# Patient Record
Sex: Male | Born: 1970
Health system: Southern US, Community
[De-identification: ages and names within clinical notes are randomized; demographics above are authoritative.]

## PROBLEM LIST (undated history)

## (undated) DIAGNOSIS — G709 Myoneural disorder, unspecified: Secondary | ICD-10-CM

## (undated) HISTORY — DX: Myoneural disorder, unspecified: G70.9

---

## 2016-08-24 ENCOUNTER — Ambulatory Visit (INDEPENDENT_AMBULATORY_CARE_PROVIDER_SITE_OTHER): Payer: 59 | Admitting: Physician Assistant

## 2016-08-24 VITALS — BP 110/70 | HR 53 | Temp 97.6°F | Resp 16 | Ht 68.5 in | Wt 189.2 lb

## 2016-08-24 DIAGNOSIS — Z130 Encounter for screening for diseases of the blood and blood-forming organs and certain disorders involving the immune mechanism: Secondary | ICD-10-CM | POA: Diagnosis not present

## 2016-08-24 DIAGNOSIS — R5383 Other fatigue: Secondary | ICD-10-CM | POA: Diagnosis not present

## 2016-08-24 DIAGNOSIS — E7800 Pure hypercholesterolemia, unspecified: Secondary | ICD-10-CM | POA: Diagnosis not present

## 2016-08-24 DIAGNOSIS — Z114 Encounter for screening for human immunodeficiency virus [HIV]: Secondary | ICD-10-CM | POA: Diagnosis not present

## 2016-08-24 DIAGNOSIS — Z0000 Encounter for general adult medical examination without abnormal findings: Secondary | ICD-10-CM | POA: Diagnosis not present

## 2016-08-24 NOTE — Progress Notes (Signed)
Matthew Shaw  MRN: [id number] DOB: 06/08/1971  PCP: Wendie Agreste, MD  Subjective:  Pt presents to clinic for a CPE.  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   Musculoskeletal:       Generalized aches from his job  Allergic/Immunologic: Positive for environmental allergies.  Neurological: Positive for headaches (intermittent - motrin resolves the pain).     No current outpatient prescriptions on file prior to visit.   No current facility-administered medications on file prior to visit.     No Known Allergies  Social History   Social History  . Marital status: Married    Spouse name: N/A  . Number of children: N/A  . Years of education: N/A   Occupational History  . Dealer    Social History Main Topics  . Smoking status: Never Smoker  . Smokeless tobacco: Never Used  . Alcohol use Yes     Comment: rare social  . Drug use: No  . Sexual activity: Yes    Partners: Female   Other Topics Concern  . None   Social History Narrative   Marital status: married   Children: 2 biological and 3 step children   Lives with: wife and 3 stepchildren all the time and his children during the summer   Employment: Dealer      Exercise:      Seatbelt: 100%   Guns in home: no            No past surgical history on file.  Family History  Problem Relation Age of Onset  . Diabetes Mother      Objective:  BP 110/70   Pulse (!) 53   Temp 97.6 F (36.4 C) (Oral)   Resp 16   Ht 5' 8.5" (1.74 m)   Wt 189 lb 3.2 oz (85.8 kg)   SpO2 98%   BMI 28.35 kg/m   Physical Exam  Constitutional: He is oriented to person, place, and time and well-developed, well-nourished, and in no distress.  HENT:  Head: Normocephalic and atraumatic.  Right Ear: Hearing, tympanic membrane, external ear and ear canal normal.  Left Ear: Hearing, tympanic membrane, external ear and ear canal normal.  Nose: Nose normal.  Mouth/Throat: Uvula is midline, oropharynx is clear and moist and mucous membranes are normal.  Eyes: Conjunctivae and EOM are normal. Pupils are equal, round, and reactive to light.  Neck: Trachea normal and normal range of motion. Neck supple. No thyroid mass and no thyromegaly present.  Cardiovascular: Normal rate, regular rhythm and normal heart sounds.   No murmur heard. Pulmonary/Chest: Effort normal and breath sounds normal.  Abdominal: Soft. Bowel sounds are normal. Hernia confirmed negative  in the right inguinal area and confirmed negative in the left inguinal area.  Genitourinary: Testes/scrotum normal and penis normal.  Musculoskeletal: Normal range of motion.  Neurological: He is alert and oriented to person, place, and time. Gait normal.  Skin: Skin is warm and dry.  Psychiatric: Mood, memory, affect and judgment normal.    Visual Acuity Screening   Right eye Left eye Both eyes  Without correction: 20/15-1 20/13 20/13-1  With correction:       Assessment and Plan :  Annual physical exam - anticipatory guidance  Elevated cholesterol - Plan: CMP14+EGFR, Lipid panel  Encounter for screening for HIV - Plan: HIV antibody  Screening for deficiency anemia - Plan: CBC with Differential/Platelet  Low energy -  Plan: TestT+TestF+SHBG   Will fill out paperwork for work once his labs return.  Windell Hummingbird PA-C  [facility] 08/24/2016 11:53 AM

## 2016-08-24 NOTE — Patient Instructions (Addendum)
I will contact you with your lab results as soon as they are available.   If you have not heard from me in 2 weeks, please contact me.  The fastest way to get your results is to register for My Chart (see the instructions on the last page of this printout).   IF you received an x-ray today, you will receive an invoice from [facility]. Please contact [facility] at [phone] with questions or concerns regarding your invoice.   IF you received labwork today, you will receive an invoice from [facility]. Please contact [facility] at [phone] with questions or concerns regarding your invoice.   Our billing staff will not be able to assist you with questions regarding bills from these companies.  You will be contacted with the lab results as soon as they are available. The fastest way to get your results is to activate your My Chart account. Instructions are located on the last page of this paperwork. If you have not heard from us regarding the results in 2 weeks, please contact this office.      

## 2016-08-27 ENCOUNTER — Encounter: Payer: Self-pay | Admitting: Physician Assistant

## 2016-08-27 DIAGNOSIS — E7800 Pure hypercholesterolemia, unspecified: Secondary | ICD-10-CM

## 2016-08-27 LAB — CMP14+EGFR
ALT: 24 IU/L (ref 0–44)
AST: 24 IU/L (ref 0–40)
Albumin/Globulin Ratio: 1.4 (ref 1.2–2.2)
Albumin: 4.4 g/dL (ref 3.5–5.5)
Alkaline Phosphatase: 61 IU/L (ref 39–117)
BILIRUBIN TOTAL: 0.3 mg/dL (ref 0.0–1.2)
BUN / CREAT RATIO: 9 (ref 9–20)
BUN: 10 mg/dL (ref 6–24)
CO2: 26 mmol/L (ref 18–29)
CREATININE: 1.14 mg/dL (ref 0.76–1.27)
Calcium: 9.7 mg/dL (ref 8.7–10.2)
Chloride: 102 mmol/L (ref 96–106)
GFR calc non Af Amer: 76 mL/min/{1.73_m2} (ref >59)
GFR, EST AFRICAN AMERICAN: 88 mL/min/{1.73_m2} (ref >59)
GLOBULIN, TOTAL: 3.2 g/dL (ref 1.5–4.5)
Glucose: 97 mg/dL (ref 65–99)
Potassium: 4.5 mmol/L (ref 3.5–5.2)
SODIUM: 142 mmol/L (ref 134–144)
TOTAL PROTEIN: 7.6 g/dL (ref 6.0–8.5)

## 2016-08-27 LAB — CBC WITH DIFFERENTIAL/PLATELET
BASOS ABS: 0 10*3/uL (ref 0.0–0.2)
Basos: 1 %
EOS (ABSOLUTE): 0.2 10*3/uL (ref 0.0–0.4)
Eos: 4 %
HEMATOCRIT: 49.8 % (ref 37.5–51.0)
HEMOGLOBIN: 16.4 g/dL (ref 13.0–17.7)
IMMATURE GRANS (ABS): 0 10*3/uL (ref 0.0–0.1)
Immature Granulocytes: 0 %
LYMPHS ABS: 2.6 10*3/uL (ref 0.7–3.1)
LYMPHS: 45 %
MCH: 28.6 pg (ref 26.6–33.0)
MCHC: 32.9 g/dL (ref 31.5–35.7)
MCV: 87 fL (ref 79–97)
MONOCYTES: 8 %
Monocytes Absolute: 0.4 10*3/uL (ref 0.1–0.9)
NEUTROS ABS: 2.4 10*3/uL (ref 1.4–7.0)
Neutrophils: 42 %
Platelets: 207 10*3/uL (ref 150–379)
RBC: 5.74 x10E6/uL (ref 4.14–5.80)
RDW: 14 % (ref 12.3–15.4)
WBC: 5.7 10*3/uL (ref 3.4–10.8)

## 2016-08-27 LAB — LIPID PANEL
Chol/HDL Ratio: 4.9 ratio units (ref 0.0–5.0)
Cholesterol, Total: 239 mg/dL — ABNORMAL HIGH (ref 100–199)
HDL: 49 mg/dL (ref >39)
LDL CALC: 151 mg/dL — AB (ref 0–99)
Triglycerides: 196 mg/dL — ABNORMAL HIGH (ref 0–149)
VLDL CHOLESTEROL CAL: 39 mg/dL (ref 5–40)

## 2016-08-27 LAB — HIV ANTIBODY (ROUTINE TESTING W REFLEX): HIV Screen 4th Generation wRfx: NONREACTIVE

## 2016-08-27 LAB — TESTT+TESTF+SHBG
Sex Hormone Binding: 63.7 nmol/L — ABNORMAL HIGH (ref 16.5–55.9)
TESTOSTERONE FREE: 14.8 pg/mL (ref 6.8–21.5)
TESTOSTERONE, TOTAL: 554.7 ng/dL (ref 264.0–916.0)

## 2016-08-27 NOTE — Addendum Note (Signed)
Addended by: Morrell RiddleWEBER, Bodhi Stenglein L on: 08/27/2016 04:54 PM   Modules accepted: Orders

## 2016-08-31 MED ORDER — ATORVASTATIN CALCIUM 40 MG PO TABS: 40.0000 mg | ORAL_TABLET | Freq: Every day | ORAL | 0 refills | Status: DC

## 2016-08-31 NOTE — Addendum Note (Signed)
Addended by: Morrell RiddleWEBER, Marlos Carmen L on: 08/31/2016 03:12 PM   Modules accepted: Orders

## 2016-09-03 ENCOUNTER — Telehealth: Payer: Self-pay | Admitting: Emergency Medicine

## 2016-09-03 MED ORDER — ATORVASTATIN CALCIUM 40 MG PO TABS: 40.0000 mg | ORAL_TABLET | Freq: Every day | ORAL | 0 refills | Status: DC

## 2016-09-03 NOTE — Telephone Encounter (Signed)
Pt needs this writtenrx - he knows through [facility]

## 2016-09-03 NOTE — Telephone Encounter (Signed)
Left message medication is at the front desk for pick up

## 2017-05-27 ENCOUNTER — Ambulatory Visit (INDEPENDENT_AMBULATORY_CARE_PROVIDER_SITE_OTHER): Payer: 59

## 2017-05-27 ENCOUNTER — Encounter: Payer: Self-pay | Admitting: Urgent Care

## 2017-05-27 ENCOUNTER — Ambulatory Visit (INDEPENDENT_AMBULATORY_CARE_PROVIDER_SITE_OTHER): Payer: 59 | Admitting: Urgent Care

## 2017-05-27 VITALS — BP 128/70 | HR 71 | Temp 98.2°F | Resp 16 | Ht 68.5 in

## 2017-05-27 DIAGNOSIS — M79604 Pain in right leg: Secondary | ICD-10-CM | POA: Diagnosis not present

## 2017-05-27 DIAGNOSIS — M7631 Iliotibial band syndrome, right leg: Secondary | ICD-10-CM | POA: Diagnosis not present

## 2017-05-27 DIAGNOSIS — M25561 Pain in right knee: Secondary | ICD-10-CM | POA: Diagnosis not present

## 2017-05-27 MED ORDER — CELECOXIB 100 MG PO CAPS: 100.0000 mg | ORAL_CAPSULE | Freq: Two times a day (BID) | ORAL | 1 refills | Status: DC

## 2017-05-27 MED ORDER — CYCLOBENZAPRINE HCL 5 MG PO TABS
5.0000 mg | ORAL_TABLET | Freq: Three times a day (TID) | ORAL | 1 refills | Status: DC | PRN
Start: 2017-05-27 — End: 2017-08-26

## 2017-05-27 NOTE — Progress Notes (Signed)
  MRN: [id number] DOB: 05/14/1971  Subjective:   Matthew Shaw is a 46 y.o. male presenting for 3 day history right sided right knee pain. Pain started after having leg cramps on Saturday, pain is sharp, has difficulty bearing weight on his leg, non-radiating. Tried icing, ibuprofen 800mg  every 4 hours. Works as a Games developerdiesel mechanic, had strenuous work load. Hydrates well. Denies fever, redness, swelling, trauma, falls, knee buckling, twisting injuries.  Molly MaduroRobert has No Known Allergies.  Molly MaduroRobert  has a past medical history of Neuromuscular disorder (HCC). Denies past surgical history.  Objective:   Vitals: BP 128/70   Pulse 71   Temp 98.2 F (36.8 C) (Oral)   Resp 16   Ht 5' 8.5" (1.74 m)   SpO2 98%   BMI 28.35 kg/m   Physical Exam  Constitutional: He is oriented to person, place, and time. He appears well-developed and well-nourished.  Cardiovascular: Normal rate.  Pulmonary/Chest: Effort normal.  Musculoskeletal:       Right knee: He exhibits normal range of motion, no swelling, no effusion, no ecchymosis, no deformity, no laceration, no erythema, normal alignment, normal patellar mobility and no bony tenderness. No tenderness found.       Right upper leg: He exhibits tenderness (over area depicted). He exhibits no bony tenderness, no swelling, no edema, no deformity and no laceration.       Legs: Neurological: He is alert and oriented to person, place, and time.  Skin: Skin is warm and dry.    Dg Knee Complete 4 Views Right  Result Date: 05/27/2017 CLINICAL DATA:  Right knee pain for 3 days. EXAM: RIGHT KNEE - COMPLETE 4+ VIEW COMPARISON:  None. FINDINGS: There is no evidence of acute fracture, dislocation, or sizable knee joint effusion. Joint space widths are preserved without significant arthropathic changes. Soft tissues are unremarkable. IMPRESSION: Negative. Electronically Signed   By: Sebastian AcheAllen  Grady M.D.   On: 05/27/2017 14:27   Assessment and Plan :    It band syndrome,  right  Pain of right lower extremity - Plan: DG Knee Complete 4 Views Right   Will start conservative management for IT band syndrome. Stop using ibuprofen, switch to celecoxib. Add Flexeril, hydrate well, work restrictions provided. Return-to-clinic precautions discussed, patient verbalized understanding.

## 2017-05-27 NOTE — Patient Instructions (Addendum)
You may take 500mg  Tylenol every 6 hours for pain and inflammation.    Iliotibial Band Syndrome Iliotibial band syndrome (ITBS) is a condition that often causes knee pain. It can also cause pain in the outside of your hip, thigh, and knee. The iliotibial band is a strip of tissue that runs from the outside of your hip and down your thigh to the outside of your knee. Repeatedly bending and straightening your knee can irritate the iliotibial band. What are the causes? This condition is caused by inflammation and irritation from the friction of the iliotibial band moving over the thigh bone (femur) when you repeatedly bend and straighten your knee. What increases the risk? This condition is more likely to develop in people who:  Frequently change elevation during their workouts.  Run very long distances.  Recently increased the length or intensity of their workouts.  Run downhill often, or just started running downhill.  Ride a bike very far or often.  You may also be at greater risk if you start a new workout routine without first warming up or if you have a job that requires you to bend, squat, or climb frequently. What are the signs or symptoms? Symptoms of this condition include:  Pain along the outside of your knee that may be worse with activity, especially running or going up and down stairs.  A "snapping" sensation over your knee.  Swelling on the outside of your knee.  Pain or a feeling of tightness in your hip.  How is this diagnosed? This condition is diagnosed based on your symptoms, medical history, and physical exam. You may also see a health care provider who specializes in reducing pain and increasing mobility (physical therapist). A physical therapist may do an exam to check your balance, movement, and way of walking or running (gait) to see whether the way you move could contribute to your injury. You may also have tests to measure your strength, flexibility, and  range of motion. How is this treated? Treatment for this condition includes:  Resting and limiting exercise.  Returning to activities gradually.  Doing range-of-motion and strengthening exercises (physical therapy) as told by your health care provider.  Including low-impact activities, such as swimming, in your exercise routine.  Follow these instructions at home:  If directed, apply ice to the injured area. ? Put ice in a plastic bag. ? Place a towel between your skin and the bag. ? Leave the ice on for 20 minutes, 2-3 times per day.  Return to your normal activities as told by your health care provider. Ask your health care provider what activities are safe for you.  Keep all follow-up visits with your health care provider. This is important. Contact a health care provider if:  Your pain does not improve or gets worse despite treatment. This information is not intended to replace advice given to you by your health care provider. Make sure you discuss any questions you have with your health care provider. Document Released: 11/16/2001 Document Revised: 06/28/2016 Document Reviewed: 06/28/2016 Elsevier Interactive Patient Education  2018 [employer]     IF you received an x-ray today, you will receive an invoice from [facility]. Please contact [facility] at [phone] with questions or concerns regarding your invoice.   IF you received labwork today, you will receive an invoice from [facility]. Please contact [facility] at [phone] with questions or concerns regarding your invoice.   Our billing staff will not be able to assist you with  questions regarding bills from these companies.  You will be contacted with the lab results as soon as they are available. The fastest way to get your results is to activate your My Chart account. Instructions are located on the last page of this paperwork. If you have not heard from us regarding the results in 2 weeks,  please contact this office.

## 2017-05-28 ENCOUNTER — Encounter: Payer: Self-pay | Admitting: Urgent Care

## 2017-05-28 ENCOUNTER — Telehealth: Payer: Self-pay

## 2017-05-28 ENCOUNTER — Telehealth: Payer: Self-pay | Admitting: Urgent Care

## 2017-05-28 NOTE — Telephone Encounter (Signed)
Letter printed, please notify [email or web address]

## 2017-05-28 NOTE — Telephone Encounter (Signed)
Copied from CRM [phone]. Topic: Inquiry >> May 27, 2017  5:04 PM Everardo PacificMoton, Kelly, [place] wrote: Reason for CRM: Patient was [facility] in the office today [facility] and received a light duty paper for his job but there isn't an end date for the light duty work. If someone from the office could please give him a call back at [phone]. Patient would like for the new light duty paper work faxed over to his job at [phone]

## 2017-05-28 NOTE — Telephone Encounter (Signed)
Letter re-sent to patient's MyChart per his request, see patient email encounter dated 05/28/17.

## 2017-05-28 NOTE — Telephone Encounter (Signed)
Pt notified that work letter is ready to be picked up at 102

## 2017-05-28 NOTE — Telephone Encounter (Signed)
Copied from CRM [phone]. Topic: General - Other >> May 28, 2017 12:06 PM Debroah LoopLander, Lumin L wrote: Reason for CRM: Patient prefers for letter to be faxed to his job at  F: [phone] or uploaded to his Mychart. Please notify him when either of these is done.

## 2017-05-29 NOTE — Telephone Encounter (Signed)
Patient notified through phone and mychart.

## 2017-06-04 ENCOUNTER — Telehealth: Payer: Self-pay | Admitting: Urgent Care

## 2017-06-04 NOTE — Telephone Encounter (Signed)
Patient needs FMLA forms completed for his light duty that [employer] [facility] put him on for his lower leg pain. I have completed what I could from the OV notes and highlighted the areas I was not sure about. I will place the forms in Mani's box on 06/04/17 please return to the FMLA/Disability box within 5-7 business days. Thank you!

## 2017-06-05 NOTE — Telephone Encounter (Signed)
I will complete them as soon as I get them in my box. As of yesterday evening, there was nothing there.

## 2017-06-06 NOTE — Telephone Encounter (Signed)
Paperwork scanned and faxed on 06/06/17

## 2017-06-09 ENCOUNTER — Encounter: Payer: Self-pay | Admitting: Urgent Care

## 2017-06-09 ENCOUNTER — Ambulatory Visit (INDEPENDENT_AMBULATORY_CARE_PROVIDER_SITE_OTHER): Payer: 59 | Admitting: Urgent Care

## 2017-06-09 VITALS — BP 112/70 | HR 53 | Temp 98.4°F | Resp 16 | Ht 68.5 in | Wt 193.0 lb

## 2017-06-09 DIAGNOSIS — M7631 Iliotibial band syndrome, right leg: Secondary | ICD-10-CM | POA: Diagnosis not present

## 2017-06-09 DIAGNOSIS — M79604 Pain in right leg: Secondary | ICD-10-CM | POA: Diagnosis not present

## 2017-06-09 NOTE — Patient Instructions (Addendum)
Iliotibial Band Syndrome Iliotibial band syndrome (ITBS) is a condition that often causes knee pain. It can also cause pain in the outside of your hip, thigh, and knee. The iliotibial band is a strip of tissue that runs from the outside of your hip and down your thigh to the outside of your knee. Repeatedly bending and straightening your knee can irritate the iliotibial band. What are the causes? This condition is caused by inflammation and irritation from the friction of the iliotibial band moving over the thigh bone (femur) when you repeatedly bend and straighten your knee. What increases the risk? This condition is more likely to develop in people who:  Frequently change elevation during their workouts.  Run very long distances.  Recently increased the length or intensity of their workouts.  Run downhill often, or just started running downhill.  Ride a bike very far or often.  You may also be at greater risk if you start a new workout routine without first warming up or if you have a job that requires you to bend, squat, or climb frequently. What are the signs or symptoms? Symptoms of this condition include:  Pain along the outside of your knee that may be worse with activity, especially running or going up and down stairs.  A "snapping" sensation over your knee.  Swelling on the outside of your knee.  Pain or a feeling of tightness in your hip.  How is this diagnosed? This condition is diagnosed based on your symptoms, medical history, and physical exam. You may also see a health care provider who specializes in reducing pain and increasing mobility (physical therapist). A physical therapist may do an exam to check your balance, movement, and way of walking or running (gait) to see whether the way you move could contribute to your injury. You may also have tests to measure your strength, flexibility, and range of motion. How is this treated? Treatment for this condition  includes:  Resting and limiting exercise.  Returning to activities gradually.  Doing range-of-motion and strengthening exercises (physical therapy) as told by your health care provider.  Including low-impact activities, such as swimming, in your exercise routine.  Follow these instructions at home:  If directed, apply ice to the injured area. ? Put ice in a plastic bag. ? Place a towel between your skin and the bag. ? Leave the ice on for 20 minutes, 2-3 times per day.  Return to your normal activities as told by your health care provider. Ask your health care provider what activities are safe for you.  Keep all follow-up visits with your health care provider. This is important. Contact a health care provider if:  Your pain does not improve or gets worse despite treatment. This information is not intended to replace advice given to you by your health care provider. Make sure you discuss any questions you have with your health care provider. Document Released: 11/16/2001 Document Revised: 06/28/2016 Document Reviewed: 06/28/2016 Elsevier Interactive Patient Education  2018 [employer]     IF you received an x-ray today, you will receive an invoice from [facility]. Please contact [facility] at [phone] with questions or concerns regarding your invoice.   IF you received labwork today, you will receive an invoice from [facility]. Please contact [facility] at [phone] with questions or concerns regarding your invoice.   Our billing staff will not be able to assist you with questions regarding bills from these companies.  You will be contacted with the lab results  as soon as they are available. The fastest way to get your results is to activate your My Chart account. Instructions are located on the last page of this paperwork. If you have not heard from [place] regarding the results in 2 weeks, please contact this office.

## 2017-06-09 NOTE — Progress Notes (Signed)
   MRN: [id number] DOB: 06/10/1970  Subjective:   Matthew CrumbleRobert A Shaw is a 46 y.o. male presenting for follow up on right lateral knee pain. Last OV for same was 05/27/2017, started management for IT band syndrome. He was started on Celebrex, Flexeril, work restrictions for light duty were given. However, his employer requested that he be on short term disability. FMLA paper work was completed. Patient went back to work on 06/04/2017, has performed his full responsibilities. He presents today to be cleared to go back to work. Reports that his pain is significantly improved.  Molly MaduroRobert has a current medication list which includes the following prescription(s): atorvastatin, celecoxib, and cyclobenzaprine. Also has No Known Allergies.  Molly MaduroRobert  has a past medical history of Neuromuscular disorder (HCC). Also  has no past surgical history on file.  Objective:   Vitals: BP 112/70   Pulse (!) 53   Temp 98.4 F (36.9 C) (Oral)   Resp 16   Ht 5' 8.5" (1.74 m)   Wt 193 lb (87.5 kg)   SpO2 97%   BMI 28.92 kg/m   Physical Exam  Constitutional: He is oriented to person, place, and time. He appears well-developed and well-nourished.  Cardiovascular: Normal rate.  Pulmonary/Chest: Effort normal.  Musculoskeletal:       Right knee: He exhibits normal range of motion, no swelling, no effusion, no ecchymosis, no deformity, no laceration, no erythema, normal alignment, normal patellar mobility and no bony tenderness. No tenderness found.  Neurological: He is alert and oriented to person, place, and time.   Assessment and Plan :   It band syndrome, right  Pain of right lower extremity   Improved, cleared to return to work. Continue Celebrex and Flexeril as needed. Patient can rtc prn. Consider PT or referral to ortho.   Wallis BambergMario Reino Lybbert, PA-C [facility] [phone] 06/09/2017  10:35 AM

## 2017-08-26 ENCOUNTER — Ambulatory Visit (INDEPENDENT_AMBULATORY_CARE_PROVIDER_SITE_OTHER): Payer: 59 | Admitting: Physician Assistant

## 2017-08-26 ENCOUNTER — Encounter: Payer: Self-pay | Admitting: Physician Assistant

## 2017-08-26 ENCOUNTER — Other Ambulatory Visit: Payer: Self-pay

## 2017-08-26 VITALS — BP 122/74 | HR 68 | Temp 97.8°F | Resp 18 | Ht 68.0 in | Wt 191.6 lb

## 2017-08-26 DIAGNOSIS — Z23 Encounter for immunization: Secondary | ICD-10-CM | POA: Diagnosis not present

## 2017-08-26 DIAGNOSIS — E7800 Pure hypercholesterolemia, unspecified: Secondary | ICD-10-CM | POA: Diagnosis not present

## 2017-08-26 DIAGNOSIS — Z1389 Encounter for screening for other disorder: Secondary | ICD-10-CM | POA: Diagnosis not present

## 2017-08-26 DIAGNOSIS — Z0000 Encounter for general adult medical examination without abnormal findings: Secondary | ICD-10-CM

## 2017-08-26 DIAGNOSIS — Z13228 Encounter for screening for other metabolic disorders: Secondary | ICD-10-CM

## 2017-08-26 DIAGNOSIS — Z130 Encounter for screening for diseases of the blood and blood-forming organs and certain disorders involving the immune mechanism: Secondary | ICD-10-CM

## 2017-08-26 LAB — POCT URINALYSIS DIP (MANUAL ENTRY)
Bilirubin, UA: NEGATIVE
GLUCOSE UA: NEGATIVE mg/dL
Ketones, POC UA: NEGATIVE mg/dL
Leukocytes, UA: NEGATIVE
NITRITE UA: NEGATIVE
PH UA: 5.5 (ref 5.0–8.0)
PROTEIN UA: NEGATIVE mg/dL
RBC UA: NEGATIVE
Spec Grav, UA: >=1.03 — AB (ref 1.010–1.025)
UROBILINOGEN UA: 0.2 U/dL

## 2017-08-26 NOTE — Patient Instructions (Signed)
     IF you received an x-ray today, you will receive an invoice from [facility]. Please contact [facility] at [phone] with questions or concerns regarding your invoice.   IF you received labwork today, you will receive an invoice from [facility]. Please contact [facility] at [phone] with questions or concerns regarding your invoice.   Our billing staff will not be able to assist you with questions regarding bills from these companies.  You will be contacted with the lab results as soon as they are available. The fastest way to get your results is to activate your My Chart account. Instructions are located on the last page of this paperwork. If you have not heard from us regarding the results in 2 weeks, please contact this office.     

## 2017-08-26 NOTE — Progress Notes (Signed)
Matthew Shaw  MRN: [id number] DOB: [birth year]  PCP: Morrell Riddle, PA-C  Subjective:  Pt presents to clinic for a CPE.  Last dental exam: once a year Last vision exam: 3 months ago  Vaccinations      Tetanus - 2008 - the last one he can remember  Typical meals for patient: 3x/meals, lunch out sandwich, dinner homecooked Typical beverage choices: Diet Dr pepper and some water Exercises: 3x/week for about 30 mins - walking Sleeps: 7 hrs per night and sleeping well   Patient Active Problem List   Diagnosis Date Noted  . Elevated cholesterol 08/24/2016    Review of Systems  Constitutional: Negative.   HENT: Negative.   Eyes: Negative.   Respiratory: Negative.   Cardiovascular: Negative.   Gastrointestinal: Negative.   Endocrine: Negative.   Genitourinary: Negative.   Musculoskeletal: Negative.   Skin: Negative.   Allergic/Immunologic: Positive for environmental allergies.  Neurological: Negative.   Hematological: Negative.   Psychiatric/Behavioral: Negative.      Current Outpatient Medications on File Prior to Visit  Medication Sig Dispense Refill  . atorvastatin (LIPITOR) 40 MG tablet Take 1 tablet (40 mg total) by mouth daily. 90 tablet 0   No current facility-administered medications on file prior to visit.     No Known Allergies  Social History   Socioeconomic History  . Marital status: Married    Spouse name: None  . Number of children: None  . Years of education: None  . Highest education level: None  Social Needs  . Financial resource strain: None  . Food insecurity - worry: None  . Food insecurity - inability: None  . Transportation needs - medical: None  . Transportation needs - non-medical: None  Occupational History  . Occupation: Curator  Tobacco Use  . Smoking status: Never Smoker  . Smokeless tobacco: Never Used  Substance and Sexual Activity  . Alcohol use: Yes    Comment: rare social  . Drug use: No  . Sexual activity: Yes   Partners: Female  Other Topics Concern  . None  Social History Narrative   Marital status: married   Children: 2 biological and 3 step children   Lives with: wife and 3 stepchildren all the time and his children during the summer   Employment: Curator      Exercise:     Seatbelt: 100%   Guns in home: no         History reviewed. No pertinent surgical history.  Family History  Problem Relation Age of Onset  . Diabetes Mother   . Hypertension Mother      Objective:  BP 122/74   Pulse 68   Temp 97.8 F (36.6 C) (Oral)   Resp 18   Ht 5\' 8"  (1.727 m)   Wt 191 lb 9.6 oz (86.9 kg)   SpO2 97%   BMI 29.13 kg/m   Physical Exam  Constitutional: He is oriented to person, place, and time and well-developed, well-nourished, and in no distress.  HENT:  Head: Normocephalic and atraumatic.  Right Ear: Hearing, tympanic membrane, external ear and ear canal normal.  Left Ear: Hearing, tympanic membrane, external ear and ear canal normal.  Nose: Nose normal.  Mouth/Throat: Uvula is midline, oropharynx is clear and moist and mucous membranes are normal.  Eyes: Conjunctivae and EOM are normal. Pupils are equal, round, and reactive to light.  Neck: Trachea normal and normal range of motion. Neck supple. No thyroid mass and no thyromegaly present.  Cardiovascular: Normal rate, regular rhythm and normal heart sounds.  No murmur heard. Pulmonary/Chest: Effort normal and breath sounds normal.  Abdominal: Soft. Bowel sounds are normal. Hernia confirmed negative in the right inguinal area and confirmed negative in the left inguinal area.  Musculoskeletal: Normal range of motion.  Neurological: He is alert and oriented to person, place, and time. Gait normal.  Skin: Skin is warm and dry.  Psychiatric: Mood, memory, affect and judgment normal.    Wt Readings from Last 3 Encounters:  08/26/17 191 lb 9.6 oz (86.9 kg)  06/09/17 193 lb (87.5 kg)  08/24/16 189 lb 3.2 oz (85.8 kg)      Visual Acuity Screening   Right eye Left eye Both eyes  Without correction: 20/20 20/15 20/15   With correction:       Assessment and Plan :  Annual physical exam  Need for Tdap vaccination - Plan: Tdap vaccine greater than or equal to 7yo IM  Elevated cholesterol - Plan: Lipid panel  Screening for iron deficiency anemia - Plan: CBC with Differential/Platelet  Screening for blood or protein in urine - Plan: POCT urinalysis dipstick  Screening for metabolic disorder - Plan: Comprehensive metabolic panel   Form filled out for insurance.  Benny LennertSarah Rene Sizelove PA-C  [facility] at [facility] 08/26/2017 12:47 PM

## 2017-08-27 LAB — COMPREHENSIVE METABOLIC PANEL
AG RATIO: 1.2 (calc) (ref 1.0–2.5)
ALBUMIN MSPROF: 4.1 g/dL (ref 3.6–5.1)
ALT: 24 U/L (ref 9–46)
AST: 27 U/L (ref 10–40)
Alkaline phosphatase (APISO): 59 U/L (ref 40–115)
BUN: 12 mg/dL (ref 7–25)
CHLORIDE: 106 mmol/L (ref 98–110)
CO2: 28 mmol/L (ref 20–32)
Calcium: 9.3 mg/dL (ref 8.6–10.3)
Creat: 1.13 mg/dL (ref 0.60–1.35)
GLOBULIN: 3.3 g/dL (ref 1.9–3.7)
GLUCOSE: 87 mg/dL (ref 65–99)
POTASSIUM: 3.8 mmol/L (ref 3.5–5.3)
SODIUM: 140 mmol/L (ref 135–146)
Total Bilirubin: 0.4 mg/dL (ref 0.2–1.2)
Total Protein: 7.4 g/dL (ref 6.1–8.1)

## 2017-08-27 LAB — LIPID PANEL
Cholesterol: 246 mg/dL — ABNORMAL HIGH (ref <200)
HDL: 35 mg/dL — ABNORMAL LOW (ref >40)
Non-HDL Cholesterol (Calc): 211 mg/dL (calc) — ABNORMAL HIGH (ref <130)
Total CHOL/HDL Ratio: 7 (calc) — ABNORMAL HIGH (ref <5.0)
Triglycerides: 670 mg/dL — ABNORMAL HIGH (ref <150)

## 2017-08-27 LAB — CBC WITH DIFFERENTIAL/PLATELET
BASOS ABS: 53 {cells}/uL (ref 0–200)
BASOS PCT: 0.8 %
EOS ABS: 172 {cells}/uL (ref 15–500)
Eosinophils Relative: 2.6 %
HCT: 45.6 % (ref 38.5–50.0)
Hemoglobin: 16 g/dL (ref 13.2–17.1)
Lymphs Abs: 3340 cells/uL (ref 850–3900)
MCH: 29 pg (ref 27.0–33.0)
MCHC: 35.1 g/dL (ref 32.0–36.0)
MCV: 82.6 fL (ref 80.0–100.0)
MPV: 11.2 fL (ref 7.5–12.5)
Monocytes Relative: 8 %
NEUTROS PCT: 38 %
Neutro Abs: 2508 cells/uL (ref 1500–7800)
Platelets: 197 10*3/uL (ref 140–400)
RBC: 5.52 10*6/uL (ref 4.20–5.80)
RDW: 13 % (ref 11.0–15.0)
TOTAL LYMPHOCYTE: 50.6 %
WBC: 6.6 10*3/uL (ref 3.8–10.8)
WBCMIX: 528 {cells}/uL (ref 200–950)

## 2017-09-08 ENCOUNTER — Telehealth: Payer: Self-pay | Admitting: Physician Assistant

## 2017-09-08 NOTE — Telephone Encounter (Signed)
Per DPR, left VM for pt to call the office and make an appt for 6 weeks out with Benny LennertSarah Weber to check cholesterol    When pt calls back, please make him an appt as close to 6 weeks from today as possible 09/08/17.with Benny LennertSarah Weber.  Thanks!

## 2018-01-01 ENCOUNTER — Ambulatory Visit (INDEPENDENT_AMBULATORY_CARE_PROVIDER_SITE_OTHER): Payer: 59 | Admitting: Physician Assistant

## 2018-01-01 ENCOUNTER — Encounter: Payer: Self-pay | Admitting: Physician Assistant

## 2018-01-01 ENCOUNTER — Other Ambulatory Visit: Payer: Self-pay

## 2018-01-01 VITALS — BP 116/70 | HR 69 | Temp 97.8°F | Resp 18 | Ht 68.0 in | Wt 190.8 lb

## 2018-01-01 DIAGNOSIS — E7800 Pure hypercholesterolemia, unspecified: Secondary | ICD-10-CM | POA: Diagnosis not present

## 2018-01-01 DIAGNOSIS — M109 Gout, unspecified: Secondary | ICD-10-CM

## 2018-01-01 MED ORDER — INDOMETHACIN 50 MG PO CAPS: 50.0000 mg | ORAL_CAPSULE | Freq: Two times a day (BID) | ORAL | 0 refills | Status: DC

## 2018-01-01 NOTE — Progress Notes (Signed)
Matthew Shaw  MRN: [id number] DOB: 23-May-1971  PCP: Mancel Bale, PA-C  Chief Complaint  Patient presents with  . paperwork    needs filled out     Subjective:  Pt presents to clinic for paperwork for [employer].  He has also started the lipitor for his cholesterol.  He is tolerating is well.  He is also having problems with his gout.  He has about 2-3 episodes a year - typically in his right foot - currently in his ankle for the last 1.5 weeks but typically his in great toe.  He has never been on allopurinol.  History is obtained by patient.  Review of Systems  Patient Active Problem List   Diagnosis Date Noted  . Gout 01/01/2018  . Elevated cholesterol 08/24/2016    Current Outpatient Medications on File Prior to Visit  Medication Sig Dispense Refill  . atorvastatin (LIPITOR) 40 MG tablet Take 1 tablet (40 mg total) by mouth daily. 90 tablet 0  . celecoxib (CELEBREX) 100 MG capsule Take 100 mg by mouth 2 (two) times daily.     No current facility-administered medications on file prior to visit.     No Known Allergies  Past Medical History:  Diagnosis Date  . Neuromuscular disorder ([place])    ocular myasthenia gravis - trouble with drooping lids   Social History   Social History Narrative   Marital status: married   Children: 2 biological and 3 step children   Lives with: wife and 3 stepchildren all the time and his children during the summer   Employment: Dealer      Exercise:     Seatbelt: 100%   Guns in home: no        Social History   Tobacco Use  . Smoking status: Never Smoker  . Smokeless tobacco: Never Used  Substance Use Topics  . Alcohol use: Yes    Comment: rare social  . Drug use: No   family history includes Diabetes in his mother; Hypertension in his mother.     Objective:  BP 116/70   Pulse 69   Temp 97.8 F (36.6 C) (Oral)   Resp 18   Ht _0  (1.727 m)   Wt 190 lb 12.8 oz (86.5 kg)   SpO2 97%   BMI 29.01 kg/m  Body  mass index is 29.01 kg/m.  Wt Readings from Last 3 Encounters:  01/01/18 190 lb 12.8 oz (86.5 kg)  08/26/17 191 lb 9.6 oz (86.9 kg)  06/09/17 193 lb (87.5 kg)    Physical Exam  Constitutional: He is oriented to person, place, and time. He appears well-developed and well-nourished.  HENT:  Head: Normocephalic and atraumatic.  Right Ear: External ear normal.  Left Ear: External ear normal.  Eyes: Conjunctivae are normal.  Neck: Normal range of motion.  Cardiovascular: Normal rate, regular rhythm and normal heart sounds.  No murmur heard. Pulmonary/Chest: Effort normal and breath sounds normal. He has no wheezes.  Neurological: He is alert and oriented to person, place, and time.  Skin: Skin is warm and dry.  Psychiatric: He has a normal mood and affect. His behavior is normal. Judgment and thought content normal.  Vitals reviewed.   Assessment and Plan :  Elevated cholesterol - Plan: Lipid panel, CMP14+EGFR - pt has restarted his lipitor - we will recheck that today and give him guidance on the next step  Gout, unspecified cause, unspecified chronicity, unspecified site - Plan: indomethacin (INDOCIN) 50 MG  capsule, Uric Acid - if uric acid if >6.5 we will start allopurinol  FORMS filled out  Patient verbalized to me that they understand the following: diagnosis, what is being done for them, what to expect and what should be done at home.  Their questions have been answered.  See after visit summary for patient specific instructions.  Windell Hummingbird PA-C  [facility] at [facility] 01/01/2018 12:00 PM  Please note: Portions of this report may have been transcribed using dragon voice recognition software. Every effort was made to ensure accuracy; however, inadvertent computerized transcription errors may be present.

## 2018-01-01 NOTE — Patient Instructions (Signed)
     IF you received an x-ray today, you will receive an invoice from [facility]. Please contact [facility] at [phone] with questions or concerns regarding your invoice.   IF you received labwork today, you will receive an invoice from [facility]. Please contact [facility] at [phone] with questions or concerns regarding your invoice.   Our billing staff will not be able to assist you with questions regarding bills from these companies.  You will be contacted with the lab results as soon as they are available. The fastest way to get your results is to activate your My Chart account. Instructions are located on the last page of this paperwork. If you have not heard from us regarding the results in 2 weeks, please contact this office.     

## 2018-01-02 LAB — CMP14+EGFR
A/G RATIO: 1.4 (ref 1.2–2.2)
ALK PHOS: 69 IU/L (ref 39–117)
ALT: 21 IU/L (ref 0–44)
AST: 29 IU/L (ref 0–40)
Albumin: 4 g/dL (ref 3.5–5.5)
BUN/Creatinine Ratio: 14 (ref 9–20)
BUN: 16 mg/dL (ref 6–24)
Bilirubin Total: 0.6 mg/dL (ref 0.0–1.2)
CALCIUM: 9 mg/dL (ref 8.7–10.2)
CO2: 24 mmol/L (ref 20–29)
Chloride: 105 mmol/L (ref 96–106)
Creatinine, Ser: 1.11 mg/dL (ref 0.76–1.27)
GFR calc Af Amer: 90 mL/min/{1.73_m2} (ref >59)
GFR, EST NON AFRICAN AMERICAN: 78 mL/min/{1.73_m2} (ref >59)
GLOBULIN, TOTAL: 2.9 g/dL (ref 1.5–4.5)
Glucose: 94 mg/dL (ref 65–99)
POTASSIUM: 5 mmol/L (ref 3.5–5.2)
SODIUM: 141 mmol/L (ref 134–144)
Total Protein: 6.9 g/dL (ref 6.0–8.5)

## 2018-01-02 LAB — LIPID PANEL
CHOL/HDL RATIO: 4.8 ratio (ref 0.0–5.0)
CHOLESTEROL TOTAL: 198 mg/dL (ref 100–199)
HDL: 41 mg/dL (ref >39)
LDL Calculated: 131 mg/dL — ABNORMAL HIGH (ref 0–99)
Triglycerides: 131 mg/dL (ref 0–149)
VLDL CHOLESTEROL CAL: 26 mg/dL (ref 5–40)

## 2018-01-02 LAB — URIC ACID: URIC ACID: 7.2 mg/dL (ref 3.7–8.6)

## 2018-01-05 MED ORDER — ALLOPURINOL 100 MG PO TABS: 100.0000 mg | ORAL_TABLET | Freq: Every day | ORAL | 1 refills | Status: DC

## 2018-01-05 NOTE — Addendum Note (Signed)
Addended by: Morrell RiddleWEBER, [place] L on: 01/05/2018 08:10 AM   Modules accepted: Orders

## 2018-01-28 ENCOUNTER — Telehealth: Payer: Self-pay | Admitting: Physician Assistant

## 2018-01-28 ENCOUNTER — Other Ambulatory Visit: Payer: Self-pay | Admitting: Physician Assistant

## 2018-01-28 DIAGNOSIS — M109 Gout, unspecified: Secondary | ICD-10-CM

## 2018-01-28 NOTE — Telephone Encounter (Signed)
Left a voicemail in regards to Mr. Matthew Shaw's appt with Benny LennertSarah Weber on 02/27/2018. The provider is leaving our office and needs to reschedule/cancel.

## 2018-02-27 ENCOUNTER — Ambulatory Visit: Payer: 59 | Admitting: Physician Assistant

## 2018-03-08 ENCOUNTER — Other Ambulatory Visit: Payer: Self-pay | Admitting: Physician Assistant

## 2018-03-08 DIAGNOSIS — M109 Gout, unspecified: Secondary | ICD-10-CM

## 2018-03-10 ENCOUNTER — Other Ambulatory Visit: Payer: Self-pay | Admitting: Physician Assistant

## 2018-03-10 DIAGNOSIS — M109 Gout, unspecified: Secondary | ICD-10-CM

## 2019-05-05 ENCOUNTER — Encounter: Payer: Self-pay | Admitting: Registered Nurse

## 2019-05-05 ENCOUNTER — Other Ambulatory Visit: Payer: Self-pay

## 2019-05-05 ENCOUNTER — Ambulatory Visit (INDEPENDENT_AMBULATORY_CARE_PROVIDER_SITE_OTHER): Payer: BC Managed Care – PPO | Admitting: Registered Nurse

## 2019-05-05 VITALS — BP 124/73 | HR 55 | Temp 98.4°F | Ht 68.0 in | Wt 200.0 lb

## 2019-05-05 DIAGNOSIS — Z13228 Encounter for screening for other metabolic disorders: Secondary | ICD-10-CM | POA: Diagnosis not present

## 2019-05-05 DIAGNOSIS — Z1211 Encounter for screening for malignant neoplasm of colon: Secondary | ICD-10-CM

## 2019-05-05 DIAGNOSIS — E7800 Pure hypercholesterolemia, unspecified: Secondary | ICD-10-CM

## 2019-05-05 DIAGNOSIS — G7000 Myasthenia gravis without (acute) exacerbation: Secondary | ICD-10-CM | POA: Diagnosis not present

## 2019-05-05 DIAGNOSIS — Z130 Encounter for screening for diseases of the blood and blood-forming organs and certain disorders involving the immune mechanism: Secondary | ICD-10-CM

## 2019-05-05 DIAGNOSIS — R0681 Apnea, not elsewhere classified: Secondary | ICD-10-CM

## 2019-05-05 DIAGNOSIS — Z131 Encounter for screening for diabetes mellitus: Secondary | ICD-10-CM | POA: Diagnosis not present

## 2019-05-05 DIAGNOSIS — Z0001 Encounter for general adult medical examination with abnormal findings: Secondary | ICD-10-CM | POA: Diagnosis not present

## 2019-05-05 DIAGNOSIS — Z1329 Encounter for screening for other suspected endocrine disorder: Secondary | ICD-10-CM | POA: Diagnosis not present

## 2019-05-05 DIAGNOSIS — M109 Gout, unspecified: Secondary | ICD-10-CM

## 2019-05-05 MED ORDER — ATORVASTATIN CALCIUM 40 MG PO TABS: 40.0000 mg | ORAL_TABLET | Freq: Every day | ORAL | 3 refills | Status: AC

## 2019-05-05 MED ORDER — ALLOPURINOL 100 MG PO TABS: 100.0000 mg | ORAL_TABLET | Freq: Every day | ORAL | 3 refills | Status: DC

## 2019-05-05 MED ORDER — ATORVASTATIN CALCIUM 40 MG PO TABS: 40.0000 mg | ORAL_TABLET | Freq: Every day | ORAL | 3 refills | Status: DC

## 2019-05-05 NOTE — Patient Instructions (Signed)
° ° ° °  If you have lab work done today you will be contacted with your lab results within the next 2 weeks.  If you have not heard from us then please contact us. The fastest way to get your results is to register for My Chart. ° ° °IF you received an x-ray today, you will receive an invoice from [facility]. Please contact [facility] at [phone] with questions or concerns regarding your invoice.  ° °IF you received labwork today, you will receive an invoice from [facility]. Please contact [facility] at [phone] with questions or concerns regarding your invoice.  ° °Our billing staff will not be able to assist you with questions regarding bills from these companies. ° °You will be contacted with the lab results as soon as they are available. The fastest way to get your results is to activate your My Chart account. Instructions are located on the last page of this paperwork. If you have not heard from us regarding the results in 2 weeks, please contact this office. °  ° ° ° °

## 2019-05-05 NOTE — Progress Notes (Signed)
Established Patient Office Visit  Subjective:  Patient ID: Matthew Shaw, male    DOB: 06-10-1971  Age: 48 y.o. MRN: [id number]  CC:  Chief Complaint  Patient presents with  . Annual Exam    declines flu and cologuard    HPI [name] presents for CPE  No complaints. Requesting sleep study as he snores frequently and his wife states witnessed apnea. Often tired during the day. Due for colonoscopy, will send referral.  History of ocular myasthenia gravis - seems like it may be worsening, will refer to neuro. It has been some time since he has seen neurology.  History reviewed and updated as appropriate  Past Medical History:  Diagnosis Date  . Neuromuscular disorder ([place])    ocular myasthenia gravis - trouble with drooping lids    No past surgical history on file.  Family History  Problem Relation Age of Onset  . Diabetes Mother   . Hypertension Mother     Social History   Socioeconomic History  . Marital status: Married    Spouse name: Not on file  . Number of children: Not on file  . Years of education: Not on file  . Highest education level: Not on file  Occupational History  . Occupation: Dealer  Social Needs  . Financial resource strain: Not on file  . Food insecurity    Worry: Not on file    Inability: Not on file  . Transportation needs    Medical: Not on file    Non-medical: Not on file  Tobacco Use  . Smoking status: Never Smoker  . Smokeless tobacco: Never Used  Substance and Sexual Activity  . Alcohol use: Yes    Comment: rare social  . Drug use: No  . Sexual activity: Yes    Partners: Female  Lifestyle  . Physical activity    Days per week: Not on file    Minutes per session: Not on file  . Stress: Not on file  Relationships  . Social Herbalist on phone: Not on file    Gets together: Not on file    Attends religious service: Not on file    Active member of club or organization: Not on file    Attends meetings of  clubs or organizations: Not on file    Relationship status: Not on file  . Intimate partner violence    Fear of current or ex partner: Not on file    Emotionally abused: Not on file    Physically abused: Not on file    Forced sexual activity: Not on file  Other Topics Concern  . Not on file  Social History Narrative   Marital status: married   Children: 2 biological and 3 step children   Lives with: wife and 3 stepchildren all the time and his children during the summer   Employment: Dealer      Exercise:     Seatbelt: 100%   Guns in home: no         Outpatient Medications Prior to Visit  Medication Sig Dispense Refill  . allopurinol (ZYLOPRIM) 100 MG tablet TAKE 1 TABLET BY MOUTH EVERY DAY 30 tablet 1  . atorvastatin (LIPITOR) 40 MG tablet Take 1 tablet (40 mg total) by mouth daily. 90 tablet 0  . celecoxib (CELEBREX) 100 MG capsule Take 100 mg by mouth 2 (two) times daily.    . indomethacin (INDOCIN) 50 MG capsule TAKE 1 CAPSULE (50 MG  TOTAL) BY MOUTH 2 (TWO) TIMES DAILY WITH A MEAL. 60 capsule 0   No facility-administered medications prior to visit.     No Known Allergies  ROS Review of Systems  Constitutional: Negative.   HENT: Negative.   Eyes: Negative.   Respiratory: Negative.   Cardiovascular: Negative.   Gastrointestinal: Negative.   Endocrine: Negative.   Genitourinary: Negative.   Musculoskeletal: Negative.   Skin: Negative.   Allergic/Immunologic: Negative.   Neurological: Negative.   Hematological: Negative.   Psychiatric/Behavioral: Negative.   All other systems reviewed and are negative.     Objective:    Physical Exam  Constitutional: He is oriented to person, place, and time. He appears well-developed and well-nourished.  HENT:  Head: Normocephalic and atraumatic.  Right Ear: External ear normal.  Left Ear: External ear normal.  Nose: Nose normal.  Mouth/Throat: Oropharynx is clear and moist. No oropharyngeal exudate.  Eyes: Pupils are  equal, round, and reactive to light. Conjunctivae and EOM are normal. Right eye exhibits no discharge. Left eye exhibits no discharge. No scleral icterus.  Neck: Normal range of motion. Neck supple. No JVD present. No tracheal deviation present. No thyromegaly present.  Cardiovascular: Normal rate, regular rhythm, normal heart sounds and intact distal pulses. Exam reveals no gallop and no friction rub.  No murmur heard. Pulmonary/Chest: Effort normal and breath sounds normal. No respiratory distress. He has no wheezes. He has no rales. He exhibits no tenderness.  Abdominal: Soft. Bowel sounds are normal. He exhibits no distension and no mass. There is no abdominal tenderness. There is no rebound and no guarding.  Musculoskeletal: Normal range of motion.        General: No tenderness, deformity or edema.  Lymphadenopathy:    He has no cervical adenopathy.  Neurological: He is alert and oriented to person, place, and time. No cranial nerve deficit. He exhibits normal muscle tone. Coordination normal.  Skin: Skin is warm and dry. No rash noted. No erythema. No pallor.  Psychiatric: He has a normal mood and affect. His behavior is normal. Judgment and thought content normal.  Nursing note and vitals reviewed.   BP 124/73   Pulse (!) 55   Temp 98.4 F (36.9 C)   Ht _0  (1.727 m)   Wt 200 lb (90.7 kg)   SpO2 96%   BMI 30.41 kg/m  Wt Readings from Last 3 Encounters:  05/05/19 200 lb (90.7 kg)  01/01/18 190 lb 12.8 oz (86.5 kg)  08/26/17 191 lb 9.6 oz (86.9 kg)     There are no preventive care reminders to display for this patient.  There are no preventive care reminders to display for this patient.  No results found for: TSH Lab Results  Component Value Date   WBC 6.6 08/26/2017   HGB 16.0 08/26/2017   HCT 45.6 08/26/2017   MCV 82.6 08/26/2017   PLT 197 08/26/2017   Lab Results  Component Value Date   NA 141 01/01/2018   K 5.0 01/01/2018   CO2 24 01/01/2018   GLUCOSE 94  01/01/2018   BUN 16 01/01/2018   CREATININE 1.11 01/01/2018   BILITOT 0.6 01/01/2018   ALKPHOS 69 01/01/2018   AST 29 01/01/2018   ALT 21 01/01/2018   PROT 6.9 01/01/2018   ALBUMIN 4.0 01/01/2018   CALCIUM 9.0 01/01/2018   Lab Results  Component Value Date   CHOL 198 01/01/2018   Lab Results  Component Value Date   HDL 41 01/01/2018   Lab  Results  Component Value Date   LDLCALC 131 (H) 01/01/2018   Lab Results  Component Value Date   TRIG 131 01/01/2018   Lab Results  Component Value Date   CHOLHDL 4.8 01/01/2018   No results found for: HGBA1C    Assessment & Plan:   Problem List Items Addressed This Visit      Other   Elevated cholesterol   Relevant Medications   atorvastatin (LIPITOR) 40 MG tablet   Other Relevant Orders   Lipid panel   Gout   Relevant Medications   allopurinol (ZYLOPRIM) 100 MG tablet    Other Visit Diagnoses    Screening for diabetes mellitus    -  Primary   Relevant Orders   Hemoglobin A1c   Myasthenia gravis ([place])       Relevant Orders   Ambulatory referral to Neurology   Witnessed episode of apnea       Relevant Orders   Ambulatory referral to Pulmonology   Special screening for malignant neoplasms, colon       Relevant Orders   Ambulatory referral to Gastroenterology   Screening for endocrine, metabolic and immunity disorder       Relevant Orders   CBC   CMP14+EGFR      Meds ordered this encounter  Medications  . allopurinol (ZYLOPRIM) 100 MG tablet    Sig: Take 1 tablet (100 mg total) by mouth daily.    Dispense:  90 tablet    Refill:  3    Order Specific Question:   Supervising Provider    Answer:   Delia Chimes A [id number]  . atorvastatin (LIPITOR) 40 MG tablet    Sig: Take 1 tablet (40 mg total) by mouth daily.    Dispense:  90 tablet    Refill:  3    Order Specific Question:   Supervising Provider    Answer:   Forrest Moron [id number]    Follow-up: No follow-ups on file.   PLAN  Normal findings  on exam  Paperwork for his job filled out - will finish when his labs are returned.  Meds refilled   Referrals sent for neuro, colonoscopy, and pulmonology  Labs drawn, will follow up if warranted  Otherwise, follow up in 1 year for CPE and labs Maximiano Coss, NP

## 2019-05-06 LAB — CMP14+EGFR
ALT: 27 IU/L (ref 0–44)
AST: 28 IU/L (ref 0–40)
Albumin/Globulin Ratio: 1.4 (ref 1.2–2.2)
Albumin: 4.3 g/dL (ref 4.0–5.0)
Alkaline Phosphatase: 62 IU/L (ref 39–117)
BUN/Creatinine Ratio: 11 (ref 9–20)
BUN: 12 mg/dL (ref 6–24)
Bilirubin Total: 0.5 mg/dL (ref 0.0–1.2)
CO2: 18 mmol/L — ABNORMAL LOW (ref 20–29)
Calcium: 9.1 mg/dL (ref 8.7–10.2)
Chloride: 105 mmol/L (ref 96–106)
Creatinine, Ser: 1.07 mg/dL (ref 0.76–1.27)
GFR calc Af Amer: 93 mL/min/{1.73_m2} (ref >59)
GFR calc non Af Amer: 81 mL/min/{1.73_m2} (ref >59)
Globulin, Total: 3 g/dL (ref 1.5–4.5)
Glucose: 102 mg/dL — ABNORMAL HIGH (ref 65–99)
Potassium: 4 mmol/L (ref 3.5–5.2)
Sodium: 140 mmol/L (ref 134–144)
Total Protein: 7.3 g/dL (ref 6.0–8.5)

## 2019-05-06 LAB — HEMOGLOBIN A1C
Est. average glucose Bld gHb Est-mCnc: 123 mg/dL
Hgb A1c MFr Bld: 5.9 % — ABNORMAL HIGH (ref 4.8–5.6)

## 2019-05-06 LAB — CBC
Hematocrit: 47.9 % (ref 37.5–51.0)
Hemoglobin: 16.1 g/dL (ref 13.0–17.7)
MCH: 28 pg (ref 26.6–33.0)
MCHC: 33.6 g/dL (ref 31.5–35.7)
MCV: 83 fL (ref 79–97)
Platelets: 203 10*3/uL (ref 150–450)
RBC: 5.75 x10E6/uL (ref 4.14–5.80)
RDW: 13.1 % (ref 11.6–15.4)
WBC: 5.2 10*3/uL (ref 3.4–10.8)

## 2019-05-06 LAB — LIPID PANEL
Chol/HDL Ratio: 5.5 ratio — ABNORMAL HIGH (ref 0.0–5.0)
Cholesterol, Total: 237 mg/dL — ABNORMAL HIGH (ref 100–199)
HDL: 43 mg/dL (ref >39)
LDL Chol Calc (NIH): 159 mg/dL — ABNORMAL HIGH (ref 0–99)
Triglycerides: 189 mg/dL — ABNORMAL HIGH (ref 0–149)
VLDL Cholesterol Cal: 35 mg/dL (ref 5–40)

## 2019-05-10 ENCOUNTER — Telehealth: Payer: Self-pay

## 2019-05-10 NOTE — Telephone Encounter (Signed)
Pt came in to pick up physical forms

## 2019-05-11 NOTE — Progress Notes (Signed)
I have called the patient and discussed his results. A1c elevated to 5.9 - monitor processed foods and sugar intake. Limit where possible Lipids elevated - he admits noncompliance with atorvastatin 40mg  PO qd - encouraged compliance. Will recheck in 1 year  Kathrin Ruddy, NP

## 2019-06-07 ENCOUNTER — Encounter: Payer: Self-pay | Admitting: Registered Nurse

## 2019-07-06 ENCOUNTER — Other Ambulatory Visit: Payer: Self-pay

## 2019-07-06 ENCOUNTER — Ambulatory Visit: Payer: BC Managed Care – PPO | Admitting: Neurology

## 2019-07-06 ENCOUNTER — Encounter: Payer: Self-pay | Admitting: Neurology

## 2019-07-06 VITALS — BP 126/79 | HR 57 | Temp 96.8°F | Ht 68.0 in | Wt 207.0 lb

## 2019-07-06 DIAGNOSIS — G7000 Myasthenia gravis without (acute) exacerbation: Secondary | ICD-10-CM | POA: Diagnosis not present

## 2019-07-06 DIAGNOSIS — G4733 Obstructive sleep apnea (adult) (pediatric): Secondary | ICD-10-CM | POA: Diagnosis not present

## 2019-07-06 MED ORDER — PYRIDOSTIGMINE BROMIDE 60 MG PO TABS: 60.0000 mg | ORAL_TABLET | Freq: Three times a day (TID) | ORAL | 6 refills | Status: AC

## 2019-07-06 NOTE — Progress Notes (Signed)
PATIENT: Matthew Shaw DOB: Apr 28, 1971  Chief Complaint  Patient presents with  . Myasthenia Gravis    Reports being diagnosed with ocular myasthenia gravis in 2003.  Approximately ten years ago, he was taking Mestinon.  His left eye droop is steadily becoming worse.   Marland Kitchen PCP    Janeece Agee, NP     HISTORICAL  Matthew Shaw is a 49 year old male, seen in request by his primary care nurse practitioner Janeece Agee for evaluation of myasthenia gravis, frequent snoring, initial evaluation was on July 06, 2019.  I have reviewed and summarized the referring note from the referring physician.  He had a past medical history of hyperlipidemia, gout, work as a Brewing technologist, which requires heavy lifting, he denies any difficulty lifting up to 100  He was diagnosed with ocular myasthenia gravis in 2003 at [facility] base, he presented with intermittent fatigable double vision mostly involving left eyelid, sometimes involving right eye, occasionally double vision, but he denies swallowing difficulty, no limb muscle weakness, he was only treated with Mestinon 60 mg every night from 2003-2008.  His symptoms has much improved, he has lost to follow-up, but he continues to deal with intermittent ptosis, especially left eye, frequent oblique double vision, his symptoms become much obvious since 2020, sometimes he has to wear sunglasses to cover his eye to avoid social embarrassment, he denies difficulty perform his job, his ptosis, and oblique double vision became more obvious if his fatigue, lack of sleep.  He denies limb muscle weakness, or bulbar muscle weakness.  He also had long history of snoring, frequent awakening at nighttime, daytime fatigue, sleepiness, on examination, he has small jaw, with narrow oropharyngeal space,   REVIEW OF SYSTEMS: Full 14 system review of systems performed and notable only for as above All other review of systems were negative.  ALLERGIES: No  Known Allergies  HOME MEDICATIONS: Current Outpatient Medications  Medication Sig Dispense Refill  . allopurinol (ZYLOPRIM) 100 MG tablet Take 1 tablet (100 mg total) by mouth daily. 90 tablet 3  . atorvastatin (LIPITOR) 40 MG tablet Take 1 tablet (40 mg total) by mouth daily. 90 tablet 3  . celecoxib (CELEBREX) 100 MG capsule Take 100 mg by mouth 2 (two) times daily.    . indomethacin (INDOCIN) 50 MG capsule TAKE 1 CAPSULE (50 MG TOTAL) BY MOUTH 2 (TWO) TIMES DAILY WITH A MEAL. 60 capsule 0   No current facility-administered medications for this visit.    PAST MEDICAL HISTORY: Past Medical History:  Diagnosis Date  . Neuromuscular disorder (HCC)    ocular myasthenia gravis - trouble with drooping lids    PAST SURGICAL HISTORY: History reviewed. No pertinent surgical history.  FAMILY HISTORY: Family History  Problem Relation Age of Onset  . Diabetes Mother   . Hypertension Mother   . Healthy Father     SOCIAL HISTORY: Social History   Socioeconomic History  . Marital status: Married    Spouse name: Not on file  . Number of children: 2  . Years of education: 24  . Highest education level: High school graduate  Occupational History  . Occupation: Curator  Tobacco Use  . Smoking status: Never Smoker  . Smokeless tobacco: Never Used  Substance and Sexual Activity  . Alcohol use: Yes    Comment: rare social  . Drug use: No  . Sexual activity: Yes    Partners: Female  Other Topics Concern  . Not on file  Social  History Narrative   Marital status: married   Children: 2 biological and 3 step children   Lives with: wife and 3 stepchildren all the time and his children during the summer   Employment: Curator      Exercise:     Seatbelt: 100%   Guns in home: no      Caffeine use: 2-3 cups per day.   Right-handed.              Social Determinants of Health   Financial Resource Strain:   . Difficulty of Paying Living Expenses: Not on file  Food  Insecurity:   . Worried About Programme researcher, broadcasting/film/video in the Last Year: Not on file  . Ran Out of Food in the Last Year: Not on file  Transportation Needs:   . Lack of Transportation (Medical): Not on file  . Lack of Transportation (Non-Medical): Not on file  Physical Activity:   . Days of Exercise per Week: Not on file  . Minutes of Exercise per Session: Not on file  Stress:   . Feeling of Stress : Not on file  Social Connections:   . Frequency of Communication with Friends and Family: Not on file  . Frequency of Social Gatherings with Friends and Family: Not on file  . Attends Religious Services: Not on file  . Active Member of Clubs or Organizations: Not on file  . Attends Banker Meetings: Not on file  . Marital Status: Not on file  Intimate Partner Violence:   . Fear of Current or Ex-Partner: Not on file  . Emotionally Abused: Not on file  . Physically Abused: Not on file  . Sexually Abused: Not on file     PHYSICAL EXAM   Vitals:   07/06/19 0906  BP: 126/79  Pulse: (!) 57  Temp: (!) 96.8 F (36 C)  Weight: 207 lb (93.9 kg)  Height: 5\' 8"  (1.727 m)    Not recorded      Body mass index is 31.47 kg/m.  PHYSICAL EXAMNIATION:  Gen: NAD, conversant, well nourised, well groomed                     Cardiovascular: Regular rate rhythm, no peripheral edema, warm, nontender. Eyes: Conjunctivae clear without exudates or hemorrhage Neck: Supple, no carotid bruits. Pulmonary: Clear to auscultation bilaterally   NEUROLOGICAL EXAM:  MENTAL STATUS: Speech:    Speech is normal; fluent and spontaneous with normal comprehension.  Cognition:     Orientation to time, place and person     Normal recent and remote memory     Normal Attention span and concentration     Normal Language, naming, repeating,spontaneous speech     Fund of knowledge   CRANIAL NERVES: CN II: Visual fields are full to confrontation. Pupils are round equal and briskly reactive to  light. CN III, IV, VI: He has fatigable left ptosis, cover one third of the upper edge of left pupil, mild left orbicularis oculi weakness, mild bilateral cheek puff weakness.  Cover and uncover testing showed right suprophopia,, left infrophoria CN V: Facial sensation is intact to light touch CN VII: Face is symmetric with normal eye closure  CN VIII: Hearing is normal to causal conversation. CN IX, X: Phonation is normal. CN XI: Head turning and shoulder shrug are intact CN XII: He has small withdrawal, narrow oral pharyngeal space  MOTOR: There is no pronator drift of out-stretched arms. Muscle bulk and tone are  normal. Muscle strength is normal.  REFLEXES: Reflexes are 2+ and symmetric at the biceps, triceps, knees, and ankles. Plantar responses are flexor.  SENSORY: Intact to light touch, pinprick and vibratory sensation are intact in fingers and toes.  COORDINATION: There is no trunk or limb dysmetria noted.  GAIT/STANCE: Posture is normal. Gait is steady with normal steps, base, arm swing, and turning. Heel and toe walking are normal. Tandem gait is normal.  Romberg is absent.   DIAGNOSTIC DATA (LABS, IMAGING, TESTING) - I reviewed patient records, labs, notes, testing and imaging myself where available.   ASSESSMENT AND PLAN  Matthew Shaw is a 49 y.o. male   Angelica Ran since 2003  Mainly restricted to eye muscles,  Complete evaluation with MRI of the brain  Laboratory evaluations including myasthenia gravis panel  Restart Mestinon 60 mg 3 times a day  May consider CT chest without contrast to rule out thymus pathology  Obstructive sleep apnea  He has loud snoring, frequent wakening, small jaw, narrowed oropharyngeal space,  Refer to sleep study   Marcial Pacas, M.D. Ph.D.  [facility] [address] [place], [place] [address] Ph: [phone] Fax: [phone]  CC: Maximiano Coss, NP

## 2019-07-12 ENCOUNTER — Telehealth: Payer: Self-pay | Admitting: Neurology

## 2019-07-12 NOTE — Telephone Encounter (Signed)
Please call patient, laboratory evaluation showed positive acetylcholine binding antibody, consistent with his diagnosis of myasthenia gravis,  Fasting lipid profile showed elevated total cholesterol, elevated LDL 159, mild elevated A1c 5.9, he should start moderate exercise, diet control  I have forwarded the laboratory evaluation to his primary care nurse practitioner Janeece Agee, he should continue work with his primary care physician for hyperlipidemia, prediabetes.

## 2019-07-12 NOTE — Telephone Encounter (Signed)
I spoke to the patient and he verbalized understanding of his lab results. He will follow up with his PCP for cholesterol and A1C.

## 2019-07-13 ENCOUNTER — Institutional Professional Consult (permissible substitution): Payer: BC Managed Care – PPO | Admitting: Neurology

## 2019-07-13 ENCOUNTER — Telehealth: Payer: Self-pay | Admitting: Neurology

## 2019-07-13 NOTE — Telephone Encounter (Signed)
Pt was a no show to his sleep consult appointment scheduled for today.

## 2019-07-16 ENCOUNTER — Encounter: Payer: Self-pay | Admitting: Neurology

## 2019-07-19 LAB — COMPREHENSIVE METABOLIC PANEL
ALT: 39 IU/L (ref 0–44)
AST: 31 IU/L (ref 0–40)
Albumin/Globulin Ratio: 1.4 (ref 1.2–2.2)
Albumin: 4.1 g/dL (ref 4.0–5.0)
Alkaline Phosphatase: 61 IU/L (ref 39–117)
BUN/Creatinine Ratio: 11 (ref 9–20)
BUN: 12 mg/dL (ref 6–24)
Bilirubin Total: 0.5 mg/dL (ref 0.0–1.2)
CO2: 25 mmol/L (ref 20–29)
Calcium: 9.1 mg/dL (ref 8.7–10.2)
Chloride: 105 mmol/L (ref 96–106)
Creatinine, Ser: 1.1 mg/dL (ref 0.76–1.27)
GFR calc Af Amer: 90 mL/min/{1.73_m2} (ref >59)
GFR calc non Af Amer: 78 mL/min/{1.73_m2} (ref >59)
Globulin, Total: 2.9 g/dL (ref 1.5–4.5)
Glucose: 91 mg/dL (ref 65–99)
Potassium: 4.1 mmol/L (ref 3.5–5.2)
Sodium: 141 mmol/L (ref 134–144)
Total Protein: 7 g/dL (ref 6.0–8.5)

## 2019-07-19 LAB — THYROID PANEL WITH TSH
Free Thyroxine Index: 2.3 (ref 1.2–4.9)
T3 Uptake Ratio: 30 % (ref 24–39)
T4, Total: 7.5 ug/dL (ref 4.5–12.0)
TSH: 4.49 u[IU]/mL (ref 0.450–4.500)

## 2019-07-19 LAB — CBC WITH DIFFERENTIAL/PLATELET
Basophils Absolute: 0 10*3/uL (ref 0.0–0.2)
Basos: 1 %
EOS (ABSOLUTE): 0.2 10*3/uL (ref 0.0–0.4)
Eos: 3 %
Hematocrit: 46.7 % (ref 37.5–51.0)
Hemoglobin: 15.9 g/dL (ref 13.0–17.7)
Immature Grans (Abs): 0 10*3/uL (ref 0.0–0.1)
Immature Granulocytes: 0 %
Lymphocytes Absolute: 2.8 10*3/uL (ref 0.7–3.1)
Lymphs: 47 %
MCH: 29.1 pg (ref 26.6–33.0)
MCHC: 34 g/dL (ref 31.5–35.7)
MCV: 85 fL (ref 79–97)
Monocytes Absolute: 0.6 10*3/uL (ref 0.1–0.9)
Monocytes: 10 %
Neutrophils Absolute: 2.3 10*3/uL (ref 1.4–7.0)
Neutrophils: 39 %
Platelets: 185 10*3/uL (ref 150–450)
RBC: 5.47 x10E6/uL (ref 4.14–5.80)
RDW: 13 % (ref 11.6–15.4)
WBC: 5.8 10*3/uL (ref 3.4–10.8)

## 2019-07-19 LAB — MYASTHENIA GRAVIS FULL PANEL
AChR Binding Ab, Serum: 1.48 nmol/L — ABNORMAL HIGH (ref 0.00–0.24)
Acetylchol Block Ab: 28 % — ABNORMAL HIGH (ref 0–25)
Acetylcholine Modulat Ab: <12 % (ref 0–20)
Anti-striation Abs: NEGATIVE

## 2019-07-19 LAB — CK: Total CK: 288 U/L (ref 49–439)

## 2019-09-08 ENCOUNTER — Ambulatory Visit: Payer: BC Managed Care – PPO | Admitting: Neurology

## 2019-09-18 IMAGING — DX DG KNEE COMPLETE 4+V*R*
4 series · 4 of 4 positions shown · non-contrast
Comparison: None.

CLINICAL DATA: Right knee pain for 3 days.

EXAM:
RIGHT KNEE - COMPLETE 4+ VIEW

[knee ap]
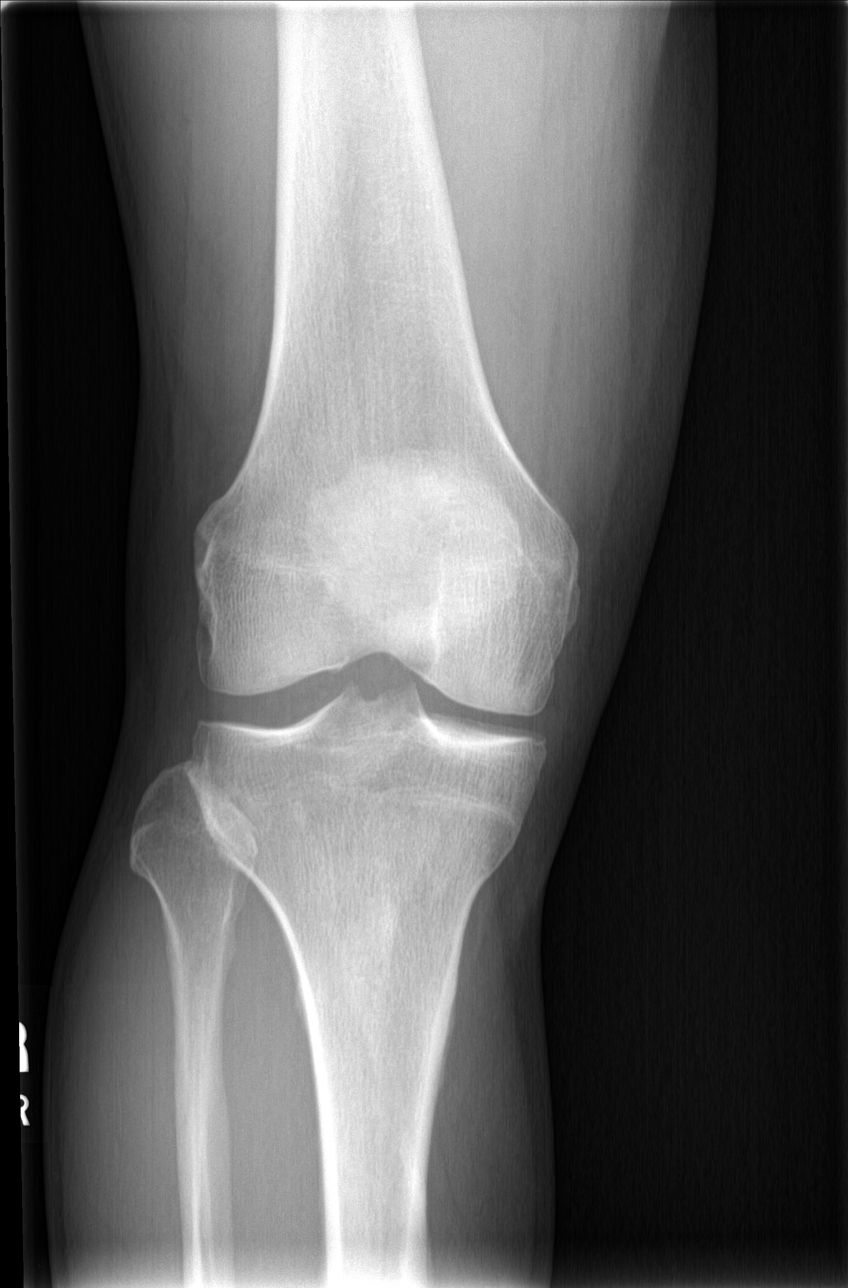

[knee obl (1 of 2)]
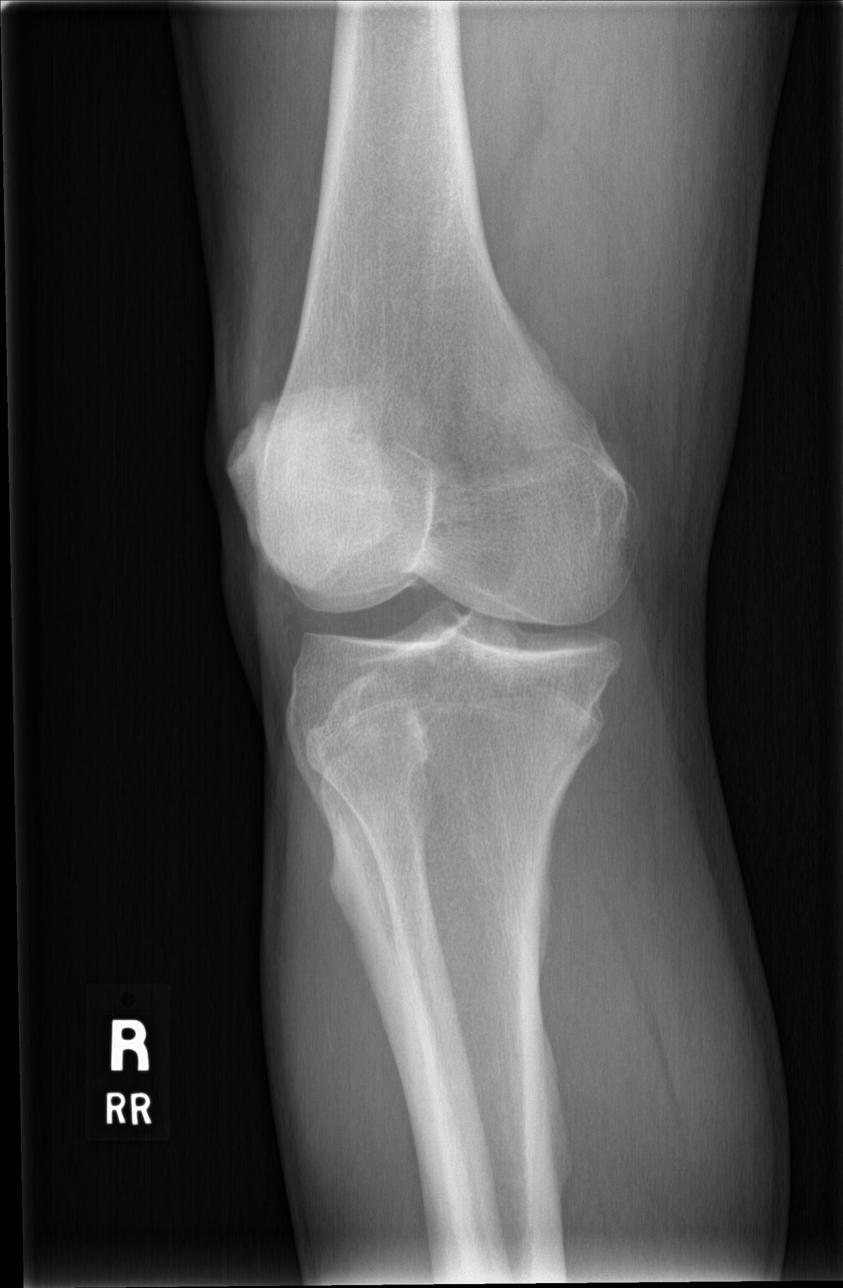

[knee obl (2 of 2)]
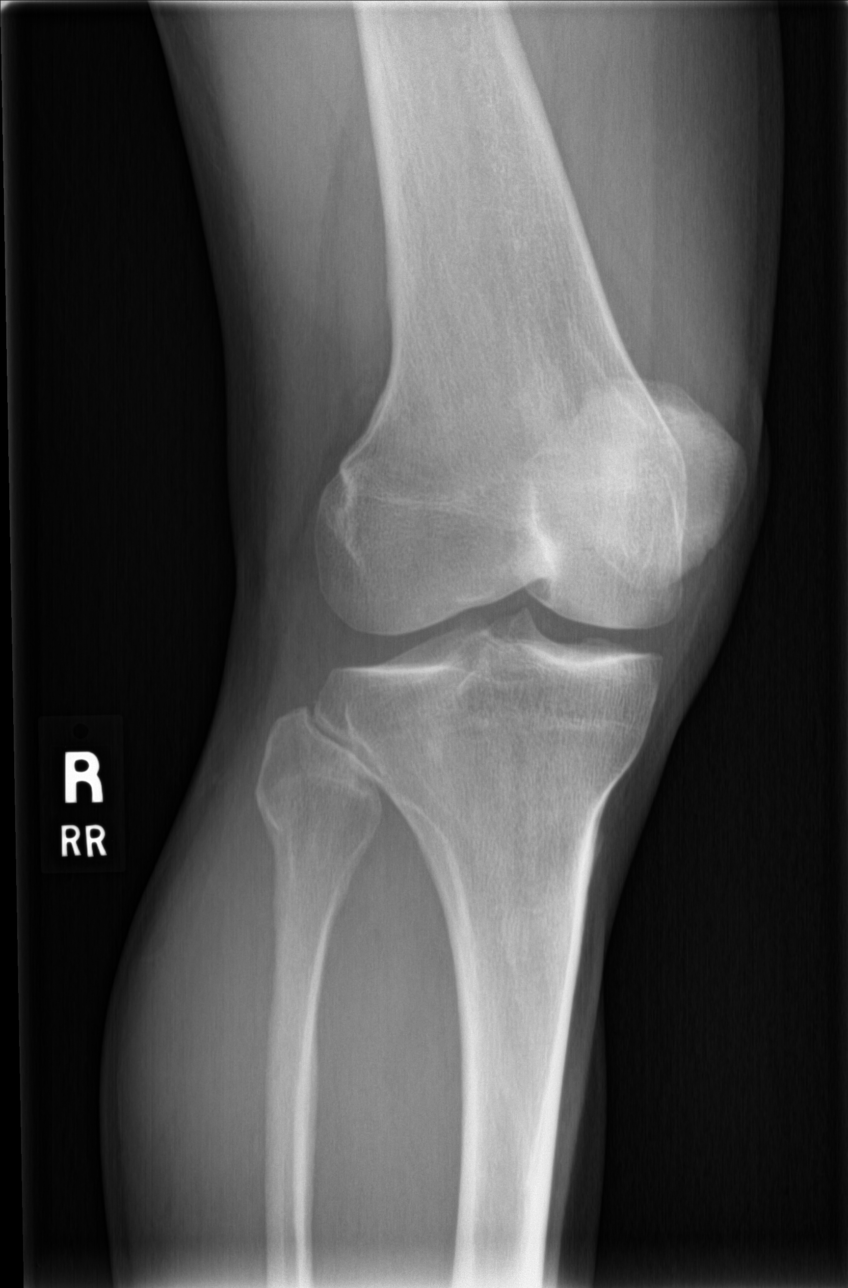

[knee lat]
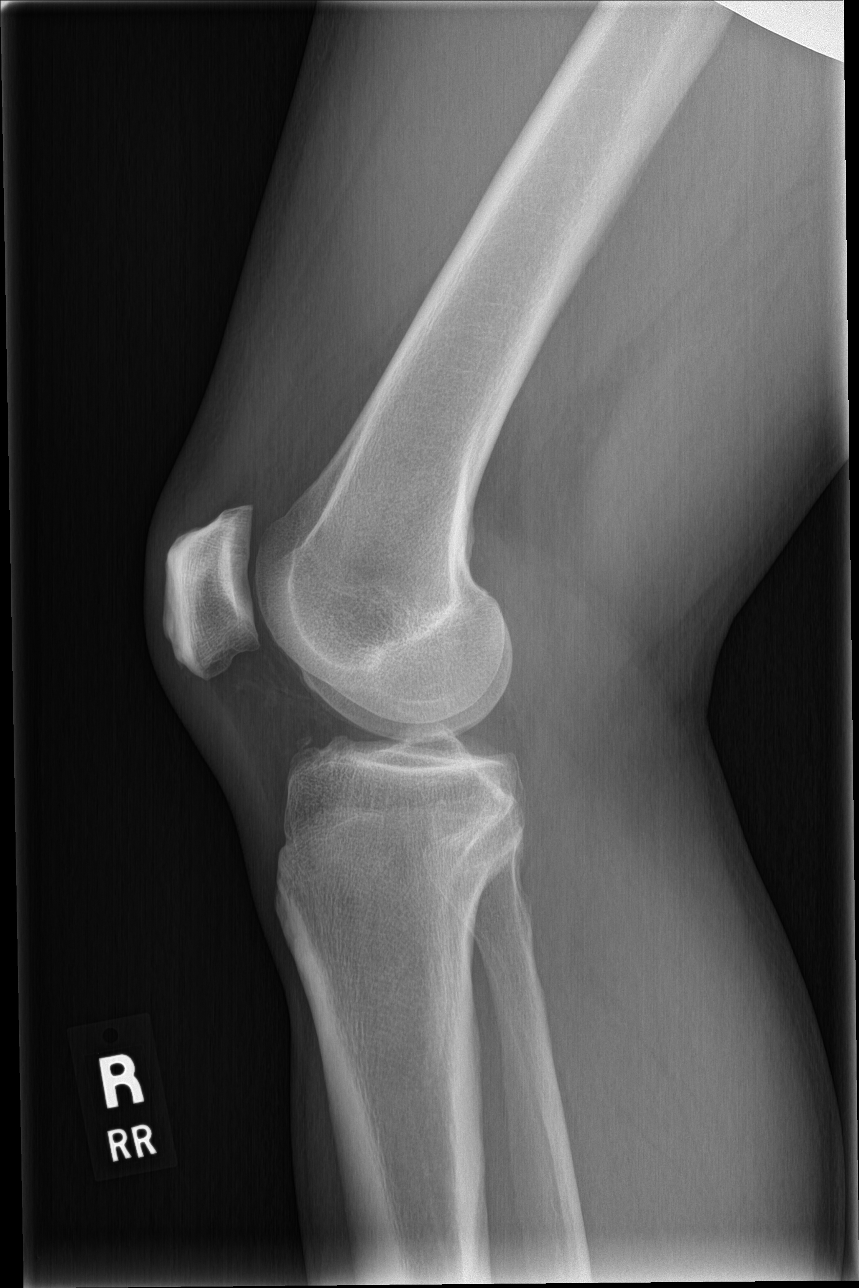

[4 of 4 positions shown; findings below may reference images not displayed]

FINDINGS: There is no evidence of acute fracture, dislocation, or sizable knee
joint effusion. Joint space widths are preserved without significant
arthropathic changes. Soft tissues are unremarkable.
IMPRESSION: Negative.

## 2019-10-18 ENCOUNTER — Institutional Professional Consult (permissible substitution): Payer: BC Managed Care – PPO | Admitting: Pulmonary Disease

## 2019-10-19 ENCOUNTER — Ambulatory Visit: Payer: BC Managed Care – PPO | Admitting: Neurology

## 2019-10-19 ENCOUNTER — Institutional Professional Consult (permissible substitution): Payer: BC Managed Care – PPO | Admitting: Pulmonary Disease

## 2019-11-23 ENCOUNTER — Institutional Professional Consult (permissible substitution): Payer: BC Managed Care – PPO | Admitting: Pulmonary Disease

## 2019-11-29 ENCOUNTER — Ambulatory Visit: Payer: BC Managed Care – PPO | Admitting: Neurology

## 2020-01-05 ENCOUNTER — Institutional Professional Consult (permissible substitution): Payer: BC Managed Care – PPO | Admitting: Pulmonary Disease

## 2020-01-17 ENCOUNTER — Ambulatory Visit: Payer: BC Managed Care – PPO | Admitting: Neurology

## 2020-01-17 ENCOUNTER — Encounter: Payer: Self-pay | Admitting: Neurology

## 2020-01-18 ENCOUNTER — Telehealth: Payer: Self-pay

## 2020-01-18 NOTE — Telephone Encounter (Signed)
Pt no showed f/u appt with Dr. Terrace Arabia on 01/17/2020.

## 2020-05-19 ENCOUNTER — Other Ambulatory Visit: Payer: Self-pay | Admitting: Registered Nurse

## 2020-05-19 DIAGNOSIS — M109 Gout, unspecified: Secondary | ICD-10-CM

## 2020-06-11 DIAGNOSIS — U071 COVID-19: Secondary | ICD-10-CM | POA: Diagnosis not present

## 2020-06-11 DIAGNOSIS — Z20822 Contact with and (suspected) exposure to covid-19: Secondary | ICD-10-CM | POA: Diagnosis not present

## 2020-06-18 DIAGNOSIS — Z20822 Contact with and (suspected) exposure to covid-19: Secondary | ICD-10-CM | POA: Diagnosis not present

## 2020-10-09 ENCOUNTER — Other Ambulatory Visit: Payer: Self-pay | Admitting: Neurology
